# Patient Record
Sex: Male | Born: 1985 | Race: Black or African American | Hispanic: Yes | Marital: Married | Smoking: Former smoker
Health system: Southern US, Community
[De-identification: ages and names within clinical notes are randomized; demographics above are authoritative.]

## PROBLEM LIST (undated history)

## (undated) HISTORY — PX: SHOULDER SURGERY: SHX246

---

## 2018-06-24 ENCOUNTER — Encounter: Payer: Self-pay | Admitting: Emergency Medicine

## 2018-06-24 ENCOUNTER — Other Ambulatory Visit: Payer: Self-pay

## 2018-06-24 ENCOUNTER — Ambulatory Visit
Admission: EM | Admit: 2018-06-24 | Discharge: 2018-06-24 | Disposition: A | Discharge status: Home / Self Care | Payer: BLUE CROSS/BLUE SHIELD | Attending: Family Medicine | Admitting: Family Medicine

## 2018-06-24 DIAGNOSIS — R21 Rash and other nonspecific skin eruption: Secondary | ICD-10-CM

## 2018-06-24 MED ORDER — PREDNISONE 10 MG (21) PO TBPK: ORAL_TABLET | ORAL | 0 refills | Status: DC

## 2018-06-24 NOTE — ED Provider Notes (Signed)
[facility]    CSN: [id number] Arrival date & time: 06/24/18  1850  History   Chief Complaint Chief Complaint  Patient presents with  . Rash   HPI   32 year old male presents with rash.   Patient reports a 3-day history of rash.  Started on the face and has now spread diffusely.  It is essentially all over his body.  Mild itching.  He is taken Benadryl and Allegra without improvement.  She is thinks that he has been exposed to a new detergent and this may be the culprit.  No exacerbating factors.  He states that no one else at home has rash.  No other associated symptoms.  No other complaints/concerns at this time  History reviewed. No pertinent past medical history.  Past Surgical History:  Procedure Laterality Date  . SHOULDER SURGERY     Home Medications    Prior to Admission medications   Medication Sig Start Date End Date Taking? Authorizing Provider  diphenhydrAMINE (BENADRYL) 25 MG tablet Take 25 mg by mouth every 6 (six) hours as needed.   Yes [provider]  fexofenadine (ALLEGRA) 180 MG tablet Take 180 mg by mouth daily.   Yes [provider]  predniSONE (STERAPRED UNI-PAK 21 TAB) 10 MG (21) TBPK tablet 6 tablets on Day 1; then decrease by 1 tablet daily until gone. 06/24/18   Tommie Samsook, Jasmon Mattice G, DO   Family History Family History  Problem Relation Age of Onset  . Diabetes Mother    Social History Social History   Tobacco Use  . Smoking status: Current Every Day Smoker    Packs/day: 0.50  . Smokeless tobacco: Never Used  Substance Use Topics  . Alcohol use: Never    Frequency: Never  . Drug use: Never   Allergies   Patient has no known allergies.  Review of Systems Review of Systems  Constitutional: Negative.   Skin: Positive for rash.   Physical Exam Triage Vital Signs ED Triage Vitals  Enc Vitals Group     BP 06/24/18 1904 (!) 147/88     Pulse Rate 06/24/18 1904 74     Resp 06/24/18 1904 18     Temp 06/24/18 1904  98.8 F (37.1 C)     Temp Source 06/24/18 1904 Oral     SpO2 06/24/18 1904 97 %     Weight 06/24/18 1901 273 lb (123.8 kg)     Height 06/24/18 1901 5\' 11"  (1.803 m)     Head Circumference --      Peak Flow --      Pain Score 06/24/18 1901 0     Pain Loc --      Pain Edu? --      Excl. in GC? --   Updated Vital Signs BP (!) 147/88 (BP Location: Left Arm)   Pulse 74   Temp 98.8 F (37.1 C) (Oral)   Resp 18   Ht 5\' 11"  (1.803 m)   Wt 273 lb (123.8 kg)   SpO2 97%   BMI 38.08 kg/m    Physical Exam  Constitutional: He is oriented to person, place, and time. He appears well-developed. No distress.  Cardiovascular: Normal rate and regular rhythm.  Pulmonary/Chest: Effort normal. No respiratory distress.  Neurological: He is alert and oriented to person, place, and time.  Skin:  Diffuse, fine erythematous papular rash.  Psychiatric: He has a normal mood and affect. His behavior is normal.  Nursing note and vitals reviewed.  UC Treatments /  Results  Labs (all labs ordered are listed, but only abnormal results are displayed) Labs Reviewed - No data to display  EKG None  Radiology No results found.  Procedures Procedures (including critical care time)  Medications Ordered in UC Medications - No data to display  Initial Impression / Assessment and Plan / UC Course  I have reviewed the triage vital signs and the nursing notes.  Pertinent labs & imaging results that were available during my care of the patient were reviewed by me and considered in my medical decision making (see chart for details).    32 year old male presents with rash.  Suspect allergic response.  Treating with prednisone.  Continue Benadryl.  If fails to improve, should see dermatology.   Final Clinical Impressions(s) / UC Diagnoses   Final diagnoses:  Rash     Discharge Instructions     Prednisone as prescribed.  Continue Benadryl.  If no improvement, see Dermatology.  Take care  Dr.  Adriana Simas    ED Prescriptions    Medication Sig Dispense Auth. Provider   predniSONE (STERAPRED UNI-PAK 21 TAB) 10 MG (21) TBPK tablet 6 tablets on Day 1; then decrease by 1 tablet daily until gone. 21 tablet Tommie Sams, DO     Controlled Substance Prescriptions [place] Controlled Substance Registry consulted? Not Applicable  Tommie Sams, DO 06/24/18 1941

## 2018-06-24 NOTE — Discharge Instructions (Signed)
Prednisone as prescribed.  Continue Benadryl.  If no improvement, see Dermatology.  Take care  Dr. Adriana Simasook

## 2018-06-24 NOTE — ED Triage Notes (Signed)
Patient reports raised, itchy rash on upper body x 3 days. He has tried OTC Benadryl and Allegra with no relief. Reports he has not changed soaps or detergents recently.

## 2020-07-31 ENCOUNTER — Ambulatory Visit: Payer: 59 | Admitting: Podiatry

## 2020-07-31 ENCOUNTER — Ambulatory Visit (INDEPENDENT_AMBULATORY_CARE_PROVIDER_SITE_OTHER): Payer: 59

## 2020-07-31 ENCOUNTER — Encounter: Payer: Self-pay | Admitting: Podiatry

## 2020-07-31 ENCOUNTER — Other Ambulatory Visit: Payer: Self-pay

## 2020-07-31 DIAGNOSIS — M2140 Flat foot [pes planus] (acquired), unspecified foot: Secondary | ICD-10-CM

## 2020-07-31 DIAGNOSIS — M722 Plantar fascial fibromatosis: Secondary | ICD-10-CM

## 2020-07-31 DIAGNOSIS — M2141 Flat foot [pes planus] (acquired), right foot: Secondary | ICD-10-CM

## 2020-07-31 DIAGNOSIS — M2142 Flat foot [pes planus] (acquired), left foot: Secondary | ICD-10-CM | POA: Diagnosis not present

## 2020-07-31 MED ORDER — METHYLPREDNISOLONE 4 MG PO TBPK: ORAL_TABLET | ORAL | 0 refills | Status: DC

## 2020-07-31 MED ORDER — MELOXICAM 15 MG PO TABS: 15.0000 mg | ORAL_TABLET | Freq: Every day | ORAL | 3 refills | Status: AC

## 2020-07-31 NOTE — Progress Notes (Signed)
Subjective:  Patient ID: Erik Church, male    DOB: 1986-05-28,  MRN: [id number] HPI Chief Complaint  Patient presents with  . Foot Pain    Patient presents today for bilat flat feet x years   he says his feet hurt all over and his ankles feel stiff.  Its a dull ache and sharp pains mostly in the afternoons.  He has been in physical therapy from [place] which has not helped.  He has also received custom orthotics from [place] which is not helping    34 y.o. male presents with the above complaint.   ROS: Denies fever chills nausea vomiting muscle aches pains calf pain back pain chest pain shortness of breath.  No past medical history on file. Past Surgical History:  Procedure Laterality Date  . SHOULDER SURGERY      Current Outpatient Medications:  .  amLODipine (NORVASC) 5 MG tablet, TAKE ONE-HALF TABLET BY MOUTH EVERY DAY FOR BLOOD PRESSURE, Disp: , Rfl:  .  sertraline (ZOLOFT) 100 MG tablet, TAKE ONE TABLET BY MOUTH ONCE EVERY DAY FOR ANXIETY, Disp: , Rfl:  .  traZODone (DESYREL) 50 MG tablet, TAKE ONE-HALF TO ONE TABLET BY MOUTH AT BEDTIME AS NEEDED FOR SLEEP, Disp: , Rfl:  .  meloxicam (MOBIC) 15 MG tablet, Take 1 tablet (15 mg total) by mouth daily., Disp: 30 tablet, Rfl: 3 .  methylPREDNISolone (MEDROL DOSEPAK) 4 MG TBPK tablet, 6 day dose pack - take as directed, Disp: 21 tablet, Rfl: 0  No Known Allergies Review of Systems Objective:  There were no vitals filed for this visit.  General: Well developed, nourished, in no acute distress, alert and oriented x3   Dermatological: Skin is warm, dry and supple bilateral. Nails x 10 are well maintained; remaining integument appears unremarkable at this time. There are no open sores, no preulcerative lesions, no rash or signs of infection present.  Vascular: Dorsalis Pedis artery and Posterior Tibial artery pedal pulses are 2/4 bilateral with immedate capillary fill time. Pedal hair growth present. No varicosities and no lower extremity  edema present bilateral.   Neruologic: Grossly intact via light touch bilateral. Vibratory intact via tuning fork bilateral. Protective threshold with Semmes Wienstein monofilament intact to all pedal sites bilateral. Patellar and Achilles deep tendon reflexes 2+ bilateral. No Babinski or clonus noted bilateral.   Musculoskeletal: No gross boney pedal deformities bilateral. No pain, crepitus, or limitation noted with foot and ankle range of motion bilateral. Muscular strength 5/5 in all groups tested bilateral.  Flatfoot deformity bilateral.  He has flat feet have very good E version but almost no inversion and it feels that his peroneals are spastic.  Most likely this is peroneal spastic flatfoot deformity as well.  He also has pain on palpation medial calcaneal tubercles bilateral.  Gait: Unassisted, Nonantalgic.    Radiographs:  Radiographs taken today demonstrate severe flatfoot deformity with soft tissue increase in density plantar fifth calcaneal insertion site no other significant osseous abnormalities are visualized.  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  At this point I would try to find someone that may be able to do peroneal muscle Botox injections.  I am also going to start him on methylprednisolone to be followed by meloxicam.  I injected the bilateral heels today and placed him in bilateral plantar fascial braces.  We will also get a night splint for him.  We discussed appropriate  shoe gear stretching exercise ice therapy shoe gear modifications I will follow-up with him in 1 month.     Rickiya Picariello T. [place], [place]

## 2020-08-23 ENCOUNTER — Telehealth: Payer: Self-pay

## 2020-08-23 DIAGNOSIS — M2140 Flat foot [pes planus] (acquired), unspecified foot: Secondary | ICD-10-CM

## 2020-08-23 NOTE — Telephone Encounter (Signed)
Referral has been entered in chart and sent to [facility]

## 2020-08-23 NOTE — Telephone Encounter (Signed)
-----   Message from Kristian Covey, [facility] sent at 08/08/2020  8:43 AM EDT ----- Regarding: Referral to Neurologist Dr. Al Corpus wanted this patient to see a Neurologist for evaluate of Botox therapy for spastic peroneal flatfoot   Dr. Lurena Joiner Tat or Dr. Raul Del Neurology

## 2020-08-27 ENCOUNTER — Other Ambulatory Visit: Payer: Self-pay | Admitting: Podiatry

## 2020-08-27 NOTE — Telephone Encounter (Signed)
Please Advise

## 2020-08-30 ENCOUNTER — Other Ambulatory Visit: Payer: Self-pay | Admitting: Podiatry

## 2020-09-04 ENCOUNTER — Encounter: Payer: Self-pay | Admitting: Podiatry

## 2020-09-04 ENCOUNTER — Other Ambulatory Visit: Payer: Self-pay

## 2020-09-04 ENCOUNTER — Ambulatory Visit: Payer: 59 | Admitting: Podiatry

## 2020-09-04 DIAGNOSIS — M7751 Other enthesopathy of right foot: Secondary | ICD-10-CM | POA: Diagnosis not present

## 2020-09-04 DIAGNOSIS — M7752 Other enthesopathy of left foot: Secondary | ICD-10-CM | POA: Diagnosis not present

## 2020-09-04 DIAGNOSIS — M2140 Flat foot [pes planus] (acquired), unspecified foot: Secondary | ICD-10-CM

## 2020-09-04 DIAGNOSIS — M722 Plantar fascial fibromatosis: Secondary | ICD-10-CM

## 2020-09-04 NOTE — Progress Notes (Signed)
He presents today states that his feet really are not any better he never received a call from the neurologist to see about the spastic flatfoot.  He states that he still hurts right here as he points to the heels and sinus tarsi areas bilaterally.  Objective: Signs are stable he is alert oriented x3.  Pulses are palpable.  Severe pes planus with peroneal spastic flatfoot he has pain on attempted inversion of the foot and forced eversion of the foot.  He has pain on palpation of the sinus tarsi.  Assessment: Severe pes planus bilateral right greater than left with sinus tarsitis bilaterally.  Plan: At this point we cannot rule out coalitions I would recommend an MRI to rule out coalitions and peroneal tears will posterior tibial tendon tears either 1 and then this is for surgical consideration.  We are going to continue to try to find a neurologist or an orthopedist that we will do Botox for peroneal spastic flatfoot.  I also injected his subtalar joint today 20 mg Kenalog 5 mg Marcaine bilaterally.  Follow-up with him once the MRI is complete.

## 2020-09-05 ENCOUNTER — Other Ambulatory Visit: Payer: Self-pay | Admitting: Podiatry

## 2020-09-05 DIAGNOSIS — M7751 Other enthesopathy of right foot: Secondary | ICD-10-CM

## 2020-09-30 ENCOUNTER — Ambulatory Visit
Admission: RE | Admit: 2020-09-30 | Discharge: 2020-09-30 | Disposition: A | Discharge status: Home / Self Care | Payer: 59 | Source: Ambulatory Visit | Attending: Podiatry | Admitting: Podiatry

## 2020-09-30 ENCOUNTER — Other Ambulatory Visit: Payer: Self-pay

## 2020-09-30 DIAGNOSIS — M7751 Other enthesopathy of right foot: Secondary | ICD-10-CM

## 2020-10-07 NOTE — Progress Notes (Signed)
Looks like a Charyl Dancer is in order to me! I'll take a look at him, thanks!

## 2020-10-18 ENCOUNTER — Telehealth: Payer: Self-pay

## 2020-10-18 NOTE — Telephone Encounter (Signed)
-----   Message from Elinor Parkinson, [place] sent at 10/07/2020  7:09 AM EDT ----- Karoline Caldwell please make an appointment for Erik Church to see Dr. Lilian Kapur.  I think that this is going to be a surgical consideration and Dr. Lilian Kapur is most equipped to deal with this.  Thanks.

## 2020-10-18 NOTE — Telephone Encounter (Signed)
Patient was called and message left on vm to call office to schedule appt with Dr. Lilian Kapur to discuss MRI results

## 2020-10-23 ENCOUNTER — Other Ambulatory Visit: Payer: Self-pay

## 2020-10-23 ENCOUNTER — Encounter: Payer: Self-pay | Admitting: Podiatry

## 2020-10-23 ENCOUNTER — Ambulatory Visit (INDEPENDENT_AMBULATORY_CARE_PROVIDER_SITE_OTHER): Payer: 59 | Admitting: Podiatry

## 2020-10-23 DIAGNOSIS — Q66221 Congenital metatarsus adductus, right foot: Secondary | ICD-10-CM

## 2020-10-23 DIAGNOSIS — M2011 Hallux valgus (acquired), right foot: Secondary | ICD-10-CM

## 2020-10-23 DIAGNOSIS — Q6689 Other  specified congenital deformities of feet: Secondary | ICD-10-CM | POA: Diagnosis not present

## 2020-10-23 DIAGNOSIS — M216X2 Other acquired deformities of left foot: Secondary | ICD-10-CM

## 2020-10-23 DIAGNOSIS — M21612 Bunion of left foot: Secondary | ICD-10-CM

## 2020-10-23 DIAGNOSIS — M21862 Other specified acquired deformities of left lower leg: Secondary | ICD-10-CM

## 2020-10-23 DIAGNOSIS — M2142 Flat foot [pes planus] (acquired), left foot: Secondary | ICD-10-CM

## 2020-10-23 DIAGNOSIS — Q66222 Congenital metatarsus adductus, left foot: Secondary | ICD-10-CM

## 2020-10-23 DIAGNOSIS — M2141 Flat foot [pes planus] (acquired), right foot: Secondary | ICD-10-CM | POA: Diagnosis not present

## 2020-10-23 DIAGNOSIS — M21861 Other specified acquired deformities of right lower leg: Secondary | ICD-10-CM

## 2020-10-23 DIAGNOSIS — M2012 Hallux valgus (acquired), left foot: Secondary | ICD-10-CM

## 2020-10-23 DIAGNOSIS — M21611 Bunion of right foot: Secondary | ICD-10-CM

## 2020-10-23 DIAGNOSIS — M216X1 Other acquired deformities of right foot: Secondary | ICD-10-CM

## 2020-10-23 NOTE — Progress Notes (Signed)
Subjective:  Patient ID: Erik Church, male    DOB: 1986/01/25,  MRN: [id number]  Chief Complaint  Patient presents with  . Ankle Pain    discuss MRI results    34 y.o. male presents with the above complaint. History confirmed with patient. He is referred to me by Dr Milinda Pointer for bilateral rigid spastic flat foot. He says he has had flat feet all his life, worsened after being in the [employer]. Recently completed MRI and is here for review. Has CMOs from the [place] which have not completely helped. He describes pain about the hindfoot and midfoot with plantar arch pain  Objective:  Physical Exam: warm, good capillary refill, no trophic changes or ulcerative lesions, normal DP and PT pulses and normal sensory exam.  Bilaterally he has severe rigid pes planovalgus with limited and painful hindfoot and midfoot ROM. Pain along peroneal tendons. Forefoot abduction with tibial crest aligned medial to 1st ray. Bunions present bilaterally with medial pinch tylomas.  Radiographs: X-ray of both feet: pes planovalgus with spurring about TN and CC, apparent incomplete coalition on oblique views  CLINICAL DATA:  Plantar right ankle pain for 15 years.  EXAM: MRI OF THE RIGHT ANKLE WITHOUT CONTRAST  TECHNIQUE: Multiplanar, multisequence MR imaging of the ankle was performed. No intravenous contrast was administered.  COMPARISON:  None.  FINDINGS: TENDONS  Peroneal: Unremarkable  Posteromedial: Trace tibialis posterior tenosynovitis.  Anterior: Unremarkable  Achilles: Unremarkable  Plantar Fascia: No significant thickening or surrounding edema. No findings of active plantar fasciitis.  LIGAMENTS  Lateral: Mildly attenuated and irregular anterior talofibular ligament, probably reflecting a healed prior injury.  Medial: Unremarkable  CARTILAGE  Ankle Joint: Mild chondral thinning with 0.5 cm focal chondral defect anteromedially along the tibial plafond and as on image  24/8.  Subtalar Joints/Sinus Tarsi: Unremarkable  Bones: Advanced for age arthropathy at the somewhat broader than expected articulation between the anterior process of the calcaneus and the navicular, for example on images 12 through 15 of series 6, difficult to exclude fibrous tarsal calcaneal navicular coalition. Dorsal spurring of the talar head along with scattered advanced for age midfoot spurring.  Other: Flattening of the longitudinal arch of the foot suggesting pes planus.  IMPRESSION: 1. Pes planus, with advanced for age midfoot spurring, dorsal spurring of the talar head, and a broad articulation between the anterior process of the calcaneus and the navicular with cortical and subcortical irregularity raising the possibility of mild fibrous calcaneonavicular tarsal coalition. 2. Trace tibialis posterior tenosynovitis. 3. Mildly attenuated and irregular anterior talofibular ligament, probably reflecting a healed prior injury. 4. 0.5 cm focal chondral defect anteromedially along the tibial plafond.   Electronically Signed   By: Van Clines M.D.   On: 10/01/2020 08:45  Assessment:   1. Tarsal coalition of right foot   2. Pes planus of both feet   3. Metatarsus adductus of both feet   4. Hallux valgus with bunions, right   5. Hallux valgus with bunions, left   6. Gastrocnemius equinus of right lower extremity   7. Gastrocnemius equinus of left lower extremity      Plan:  Patient was evaluated and treated and all questions answered.  I discussed the etiology, pathomechanics, and surgical nonsurgical treatment options in detail with the patient today.  I also reviewed his most recent plain film radiographs and right ankle MRI.  He has previously had custom-made orthotics from the [facility] and these were not helpful.  He has a rigid spastic  flatfoot.  I recommend surgical intervention, as he has thus far failed nonsurgical therapy.   Discussed with him twosurgical options, resecting the tarsal coalition in conjunction with a gastrocnemius recession versus this plus surgical reconstruction of his flatfoot deformity.  I discussed the risks and benefits of both pathways, it is possible that majority of his symptoms could be relieved by resecting the coalition allowing his joints to move in situ in his current pes planus position.    Also discussed with him that his pes planus is severe, and it is possible that he will continue to have pain and develop arthritis later in life in his hindfoot.  This could be addressed at a later date if it is symptomatic.  He will consider both options.  I discussed with him the postop protocols for both.  After reviewing his MRIs I do not think that he will need arthrodesis of his hindfoot, reconstruction would likely include a Badgley procedure to resect the coalition, a Strayer gastrocnemius recession, Cotton osteotomy of the medial cuneiform, Evans osteotomy and medial displacement osteotomy of the calcaneus, and possible flexor digitorum longus transfer.  I discussed with him how his met adductus contributes to the deviation of the forefoot, and this is currently a mass due to his hindfoot eversion and forefoot abduction.  It is possible he will develop a symptomatic bunion after correction of the hindfoot, if this is symptomatic in the future we can address this but I would not address this at this time.  He will consider the above and return when he is ready to plan surgery in the next few months.  At his next visit when he is ready to schedule his surgery we will take long-leg calcaneal axial views to evaluate the hindfoot eversion.   Return for return when ready to plan surgery.

## 2020-10-23 NOTE — Patient Instructions (Addendum)
Procedures we will consider: removing the coalition, Evans osteotomy, Cotton osteotomy, medial displacement osteotomy of the calcaneus (heel bone), gastrocnemius lengthening (calf muscle), tendon transfer (flexor digitorum longus)     Tarsal Coalition  Tarsal coalition is a type of foot disorder. Seven bones (tarsals) make up the back of your foot, which includes the heel. Normally, these bones move freely within the foot and alongside one another. If you have a tarsal coalition, two or more of these bones have abnormally fused together in a bridge (bar) of bone, cartilage, or fibrous tissue. This can cause pain, difficulty walking, and other foot problems. Tarsal coalition can affect one foot or both feet. The condition can be mild or severe, depending on:  How many tarsal bones are affected. Often, two bones are affected, but it can be more.  The size of the bars. Larger bars lead to more severe symptoms. What are the causes? The most common cause of this condition is a defect in the genes (mutation) that is passed down through families. In rare cases, this condition can be caused by:  Arthritis in the feet.  A previous foot injury.  An infection. What increases the risk? This condition is more likely to develop in:  Children and adolescents.  People who have arthritis.  People who have had a previous foot injury or infection. What are the signs or symptoms? Symptoms of tarsal coalition often begin in childhood or adolescence as bones begin to harden. Symptoms include:  A flat foot.  Difficulty walking.  Walking with a limp.  Foot stiffness.  Tiring easily when walking or standing.  Pain when standing or walking.  Muscle spasms. How is this diagnosed? This condition is diagnosed based on your symptoms and medical history. Your health care provider will do a physical exam to check your foot. Your health care provider may also do imaging tests to confirm the diagnosis.  These include:  X-ray.  MRI.  CT scan. How is this treated? Treatment for tarsal coalition depends on the severity of your condition. Treatment may include:  Rest.  Over-the-counter medicines for pain, such as NSAIDs.  Steroid injections.  Physical therapy.  Orthotics, such as heel cups, wedges, and arch supports.  A cast or a walking boot. You may need surgery to repair the tarsal coalition if nonsurgical treatments do not help. Follow these instructions at home: If you have a boot:  Wear the boot as told by your health care provider. Remove it only as told by your health care provider.  Loosen the boot if your toes tingle, become numb, or turn cold and blue.  Keep the boot clean.  If the boot is not waterproof: ? Do not let it get wet. ? Cover it with a watertight covering when you take a bath or shower. If you have a cast:  Do not stick anything inside the cast to scratch your skin. Doing that increases your risk of infection.  Check the skin around the cast every day. Tell your health care provider about any concerns.  You may put lotion on dry skin around the edges of the cast. Do not put lotion on the skin underneath the cast.  Keep the cast clean.  If the cast is not waterproof: ? Do not let it get wet. ? Cover it with a watertight covering when you take a bath or a shower. Managing pain, stiffness, and swelling   If directed, put ice on the injured area. ? If you have a  removable walking boot, remove it as told by your health care provider. ? Put ice in a plastic bag. ? Place a towel between your skin and the bag or between your cast and the bag. ? Leave the ice on for 20 minutes, 2-3 times per day.  Move your toes often to reduce stiffness and swelling.  Raise (elevate) the injured area above the level of your heart while you are sitting or lying down. General instructions  Take over-the-counter and prescription medicines only as told by your  health care provider.  Ask your health care provider when it is safe to drive if you have a cast or a walking boot on your foot.  Return to your normal activities as told by your health care provider. Ask your health care provider what activities are safe for you.  Do exercises daily as told by your health care provider or physical therapist.  Keep all follow-up visits as told by your health care provider. This is important. Contact a health care provider if:  Your pain gets worse.  You have new pain in your foot.  Your muscle spasms get worse.  You have a harder time with walking. Get help right away if:  Your foot is numb or cold.  Your toenails turn blue, gray, or another dark color. Summary  Tarsal coalition is a type of foot disorder. Seven bones (tarsals) make up the back of your foot, which includes the heel.  If you have a tarsal coalition, two or more of these bones have abnormally fused together in a bridge (bar) of bone, cartilage, or fibrous tissue.  This condition may be treated with rest, medicines, physical therapy, orthotics, or a cast or walking boot.  Follow your health care provider's instructions for self-care, including how to care for a walking boot or cast.  Contact your health care provider if you have new pain or your pain gets worse. Get help right away if your foot is numb or cold, or if your toenails turn blue, gray, or dark. This information is not intended to replace advice given to you by your health care provider. Make sure you discuss any questions you have with your health care provider. Document Revised: 11/03/2018 Document Reviewed: 11/03/2018 Elsevier Patient Education  2020 [employer].

## 2020-12-18 ENCOUNTER — Encounter: Payer: Self-pay | Admitting: Podiatry

## 2022-01-06 ENCOUNTER — Other Ambulatory Visit: Payer: Self-pay

## 2022-01-06 ENCOUNTER — Ambulatory Visit (INDEPENDENT_AMBULATORY_CARE_PROVIDER_SITE_OTHER): Payer: 59

## 2022-01-06 ENCOUNTER — Ambulatory Visit
Admission: EM | Admit: 2022-01-06 | Discharge: 2022-01-06 | Disposition: A | Discharge status: Home / Self Care | Payer: 59 | Attending: Internal Medicine | Admitting: Internal Medicine

## 2022-01-06 ENCOUNTER — Encounter: Payer: Self-pay | Admitting: Licensed Clinical Social Worker

## 2022-01-06 DIAGNOSIS — M542 Cervicalgia: Secondary | ICD-10-CM | POA: Diagnosis not present

## 2022-01-06 DIAGNOSIS — S161XXA Strain of muscle, fascia and tendon at neck level, initial encounter: Secondary | ICD-10-CM

## 2022-01-06 MED ORDER — DICLOFENAC SODIUM 1 % EX GEL: 2.0000 g | Freq: Four times a day (QID) | CUTANEOUS | 0 refills | Status: AC

## 2022-01-06 NOTE — ED Triage Notes (Signed)
Pt c/o neck pain after stretching this morning, feels tight and having limited movement.

## 2022-01-06 NOTE — ED Provider Notes (Signed)
[facility]    CSN: [id number] Arrival date & time: 01/06/22  1221      History   Chief Complaint Chief Complaint  Patient presents with   Neck Injury    HPI Erik Church is a 36 y.o. male. He was stretching this am and hyperextended neck while externally rotating his shoulders; experienced the sudden onset of pain in the junction of his left neck/shoulder.  His neck now feels stiff/painful with pain in left neck/shoulder on lateral rotation/flexion of the neck.  No weakness/clumsiness of arms/legs, no bowel/bladder function change.  800 mg ibuprofen x 1 dose since onset is not helping.  HPI  History reviewed. No pertinent past medical history.  There are no problems to display for this patient.   Past Surgical History:  Procedure Laterality Date   SHOULDER SURGERY         Home Medications    Prior to Admission medications   Medication Sig Start Date End Date Taking? Authorizing Provider  amLODipine (NORVASC) 5 MG tablet TAKE ONE-HALF TABLET BY MOUTH EVERY DAY FOR BLOOD PRESSURE 05/08/20  Yes [provider]  diclofenac Sodium (VOLTAREN) 1 % GEL Apply 2 g topically 4 (four) times daily. 01/06/22  Yes Isa Rankin, MD  sertraline (ZOLOFT) 100 MG tablet TAKE ONE TABLET BY MOUTH ONCE EVERY DAY FOR ANXIETY 05/08/20  Yes [provider]  meloxicam (MOBIC) 15 MG tablet Take 1 tablet (15 mg total) by mouth daily. 07/31/20   Hyatt, Max T, DPM  traZODone (DESYREL) 50 MG tablet TAKE ONE-HALF TO ONE TABLET BY MOUTH AT BEDTIME AS NEEDED FOR SLEEP 05/08/20   [provider]    Family History Family History  Problem Relation Age of Onset   Diabetes Mother     Social History Social History   Tobacco Use   Smoking status: Every Day    Packs/day: 0.50    Types: Cigarettes   Smokeless tobacco: Never  Vaping Use   Vaping Use: Never used  Substance Use Topics   Alcohol use: Never   Drug use: Never     Allergies   Patient has  no known allergies.   Review of Systems Review of Systems see HPI   Physical Exam Triage Vital Signs ED Triage Vitals  Enc Vitals Group     BP 01/06/22 1329 131/74     Pulse Rate 01/06/22 1329 89     Resp 01/06/22 1328 16     Temp 01/06/22 1329 98.2 F (36.8 C)     Temp Source 01/06/22 1329 Oral     SpO2 01/06/22 1329 98 %     Weight 01/06/22 1326 290 lb (131.5 kg)     Height 01/06/22 1326 5\' 11"  (1.803 m)     Pain Score 01/06/22 1326 7     Pain Loc --    Updated Vital Signs BP 131/74 (BP Location: Left Arm)    Pulse 89    Temp 98.2 F (36.8 C) (Oral)    Resp 16    Ht 5\' 11"  (1.803 m)    Wt 131.5 kg    SpO2 98%    BMI 40.45 kg/m   Physical Exam Constitutional:      General: He is not in acute distress.    Appearance: He is not ill-appearing.     Comments: Looks worried  HENT:     Head: Atraumatic.     Mouth/Throat:     Mouth: Mucous membranes are moist.  Eyes:  Conjunctiva/sclera:     Right eye: Right conjunctiva is not injected. No exudate.    Left eye: Left conjunctiva is not injected. No exudate.    Comments: Conjugate gaze observed  Cardiovascular:     Rate and Rhythm: Normal rate.  Pulmonary:     Effort: Pulmonary effort is normal. No respiratory distress.  Abdominal:     General: There is no distension.  Musculoskeletal:     Comments: Painful in left neck/shoulder to laterally rotate neck, laterally flex neck.  Able to extend.  Feels best to forward flex.  No midline posterior percussion tenderness. Able to fully extend B shoulders overhead, int/ext rotate.  No weakness in arms. Able to walk into urgent care independently and climb on/off exam table  Skin:    General: Skin is warm and dry.     Comments: No cyanosis  Neurological:     Mental Status: He is alert.     Comments: Face symmetric; speech clear/coherent/logical     UC Treatments / Results  Labs (all labs ordered are listed, but only abnormal results are displayed) Labs Reviewed - No  data to display NA  EKG NA  Radiology DG Cervical Spine 2-3 Views  Result Date: 01/06/2022 CLINICAL DATA:  Neck injury. EXAM: CERVICAL SPINE - 2-3 VIEW COMPARISON:  None. FINDINGS: The cervical vertebral bodies are normally aligned. Disc spaces and vertebral bodies are maintained. No significant degenerative changes. No acute bony findings or abnormal prevertebral soft tissue swelling. The facets are normally aligned. The C1-2 articulations are maintained. The lung apices are clear. IMPRESSION: Normal alignment and no acute bony findings. Electronically Signed   By: Rudie Meyer M.D.   On: 01/06/2022 14:10    Procedures Procedures (including critical care time) Ice bag applied to Left neck/shoulder junction, with good improvement in pain  Medications Ordered in UC Medications - No data to display NA  Initial Impression / Assessment and Plan / UC Course  Fracture unlikely given absence of focal/bony tenderness.  Vascular accident unlikely given improvement in pain with ice and absence of rapidly progressive soft tissue swelling in lateral neck, neurologic findings on exam.  Findings suggest soft tissue injury.  Manage symptoms and observe for improvement.      Final Clinical Impressions(s) / UC Diagnoses   Final diagnoses:  Acute strain of neck muscle, initial encounter     Discharge Instructions      Anticipate gradual improvement in neck/shoulder discomfort over the next several days.  No danger signs on exam.  Xray without bony abnormalities.  Note for work today.  Okay to stretch gently, but avoid activities that markedly increase the pain.  Use ice for 10 to 15 minutes several times daily, to reduce inflammation and help with pain.  Prescription for diclofenac gel (anti-inflammatory/pain reliever) sent to the pharmacy. Physical therapy for neck/shoulder discomfort is often very helpful in managing discomfort that lasts for more than a week or two.     ED Prescriptions      Medication Sig Dispense Auth. Provider   diclofenac Sodium (VOLTAREN) 1 % GEL Apply 2 g topically 4 (four) times daily. 150 g Isa Rankin, MD      PDMP not reviewed this encounter.   Isa Rankin, MD 01/07/22 1154

## 2022-01-06 NOTE — Discharge Instructions (Addendum)
Anticipate gradual improvement in neck/shoulder discomfort over the next several days.  No danger signs on exam.  Xray without bony abnormalities.  Note for work today.  Okay to stretch gently, but avoid activities that markedly increase the pain.  Use ice for 10 to 15 minutes several times daily, to reduce inflammation and help with pain.  Prescription for diclofenac gel (anti-inflammatory/pain reliever) sent to the pharmacy. Physical therapy for neck/shoulder discomfort is often very helpful in managing discomfort that lasts for more than a week or two.

## 2022-12-05 ENCOUNTER — Ambulatory Visit
Admission: EM | Admit: 2022-12-05 | Discharge: 2022-12-05 | Disposition: A | Discharge status: Home / Self Care | Payer: BC Managed Care – PPO | Attending: Physician Assistant | Admitting: Physician Assistant

## 2022-12-05 ENCOUNTER — Ambulatory Visit (INDEPENDENT_AMBULATORY_CARE_PROVIDER_SITE_OTHER): Payer: BC Managed Care – PPO

## 2022-12-05 ENCOUNTER — Encounter: Payer: Self-pay | Admitting: Emergency Medicine

## 2022-12-05 DIAGNOSIS — M79644 Pain in right finger(s): Secondary | ICD-10-CM

## 2022-12-05 DIAGNOSIS — S60029A Contusion of unspecified index finger without damage to nail, initial encounter: Secondary | ICD-10-CM | POA: Diagnosis not present

## 2022-12-05 DIAGNOSIS — W2209XA Striking against other stationary object, initial encounter: Secondary | ICD-10-CM

## 2022-12-05 NOTE — ED Triage Notes (Signed)
Patient states that he hit his right 2nd finger on a dresser about 2 hours ago.  Patient c/o pain and swelling in his finger.

## 2022-12-05 NOTE — ED Provider Notes (Signed)
[facility]    CSN: [id number] Arrival date & time: 12/05/22  1335      History   Chief Complaint Chief Complaint  Patient presents with   Finger Injury    Right 2nd finger    HPI Earvin Blazier is a 36 y.o. male who presents due to hitting his 2nd R finger on the dresser  on dorsal mid finger area 2 hours ago and got swollen right away. Has pain with palpation of area of pain. Able to move it fine.   History reviewed. No pertinent past medical history.  There are no problems to display for this patient.   Past Surgical History:  Procedure Laterality Date   SHOULDER SURGERY         Home Medications    Prior to Admission medications   Medication Sig Start Date End Date Taking? Authorizing Provider  amLODipine (NORVASC) 5 MG tablet TAKE ONE-HALF TABLET BY MOUTH EVERY DAY FOR BLOOD PRESSURE 05/08/20   [provider]  diclofenac Sodium (VOLTAREN) 1 % GEL Apply 2 g topically 4 (four) times daily. 01/06/22   Isa Rankin, MD  meloxicam (MOBIC) 15 MG tablet Take 1 tablet (15 mg total) by mouth daily. 07/31/20   Hyatt, Max T, DPM  sertraline (ZOLOFT) 100 MG tablet TAKE ONE TABLET BY MOUTH ONCE EVERY DAY FOR ANXIETY 05/08/20   [provider]  traZODone (DESYREL) 50 MG tablet TAKE ONE-HALF TO ONE TABLET BY MOUTH AT BEDTIME AS NEEDED FOR SLEEP 05/08/20   [provider]    Family History Family History  Problem Relation Age of Onset   Diabetes Mother     Social History Social History   Tobacco Use   Smoking status: Former    Packs/day: 0.50    Types: Cigarettes   Smokeless tobacco: Never  Vaping Use   Vaping Use: Never used  Substance Use Topics   Alcohol use: Never   Drug use: Never     Allergies   Patient has no known allergies.   Review of Systems Review of Systems  Musculoskeletal:  Positive for arthralgias.  Skin:  Negative for color change, pallor and wound.  Neurological:  Negative for weakness and  numbness.     Physical Exam Triage Vital Signs ED Triage Vitals  Enc Vitals Group     BP 12/05/22 1458 129/82     Pulse Rate 12/05/22 1458 69     Resp 12/05/22 1458 16     Temp 12/05/22 1458 98.4 F (36.9 C)     Temp Source 12/05/22 1458 Oral     SpO2 12/05/22 1458 99 %     Weight 12/05/22 1456 300 lb (136.1 kg)     Height 12/05/22 1456 5\' 11"  (1.803 m)     Head Circumference --      Peak Flow --      Pain Score 12/05/22 1456 3     Pain Loc --      Pain Edu? --      Excl. in GC? --    No data found.  Updated Vital Signs BP 129/82 (BP Location: Left Arm)   Pulse 69   Temp 98.4 F (36.9 C) (Oral)   Resp 16   Ht 5\' 11"  (1.803 m)   Wt 300 lb (136.1 kg)   SpO2 99%   BMI 41.84 kg/m   Visual Acuity Right Eye Distance:   Left Eye Distance:   Bilateral Distance:    Right Eye Near:  Left Eye Near:    Bilateral Near:     Physical Exam Vitals and nursing note reviewed.  Constitutional:      General: He is not in acute distress.    Appearance: He is obese. He is not toxic-appearing.  Eyes:     General: No scleral icterus.    Conjunctiva/sclera: Conjunctivae normal.  Pulmonary:     Effort: Pulmonary effort is normal.  Musculoskeletal:        General: Normal range of motion.     Cervical back: Neck supple.     Comments: R INDEX FINGER- has mild swelling, no ecchymosis or trauma of skin. Is tender on mid shaft of this finger on dorsal region. ROM is normal.   Skin:    General: Skin is warm and dry.     Capillary Refill: Capillary refill takes less than 2 seconds.     Findings: No rash.  Neurological:     General: No focal deficit present.     Mental Status: He is alert and oriented to person, place, and time.     Gait: Gait normal.  Psychiatric:        Mood and Affect: Mood normal.        Behavior: Behavior normal.        Thought Content: Thought content normal.        Judgment: Judgment normal.      UC Treatments / Results  Labs (all labs ordered are  listed, but only abnormal results are displayed) Labs Reviewed - No data to display  EKG   Radiology DG Finger Index Right  Result Date: 12/05/2022 CLINICAL DATA:  Right second finger pain after hitting on dresser today. EXAM: RIGHT INDEX FINGER 2+V COMPARISON:  None Available. FINDINGS: There is no evidence of fracture or dislocation. There is no evidence of arthropathy or other focal bone abnormality. Soft tissues are unremarkable. IMPRESSION: Negative. Electronically Signed   By: Signa Kell M.D.   On: 12/05/2022 15:29    Procedures Procedures (including critical care time)  Medications Ordered in UC Medications - No data to display  Initial Impression / Assessment and Plan / UC Course  I have reviewed the triage vital signs and the nursing notes.  Pertinent  imaging results that were available during my care of the patient were reviewed by me and considered in my medical decision making (see chart for details).  R index finger contusion  See instructions   Final Clinical Impressions(s) / UC Diagnoses   Final diagnoses:  Contusion of index finger without damage to nail, initial encounter     Discharge Instructions      Ice area of pain for 15 minutes 3-4 times a day today and tomorrow You may take Ibuprofen or Tylenol as needed for pain.      ED Prescriptions   None    PDMP not reviewed this encounter.   Garey Ham, PA-C 12/05/22 1609

## 2022-12-05 NOTE — Discharge Instructions (Addendum)
Ice area of pain for 15 minutes 3-4 times a day today and tomorrow You may take Ibuprofen or Tylenol as needed for pain.

## 2024-01-04 IMAGING — CR DG CERVICAL SPINE 2 OR 3 VIEWS
4 series · 4 of 4 positions shown · non-contrast
Comparison: None.

CLINICAL DATA: Neck injury.

EXAM:
CERVICAL SPINE - 2-3 VIEW

[c-spine lat]
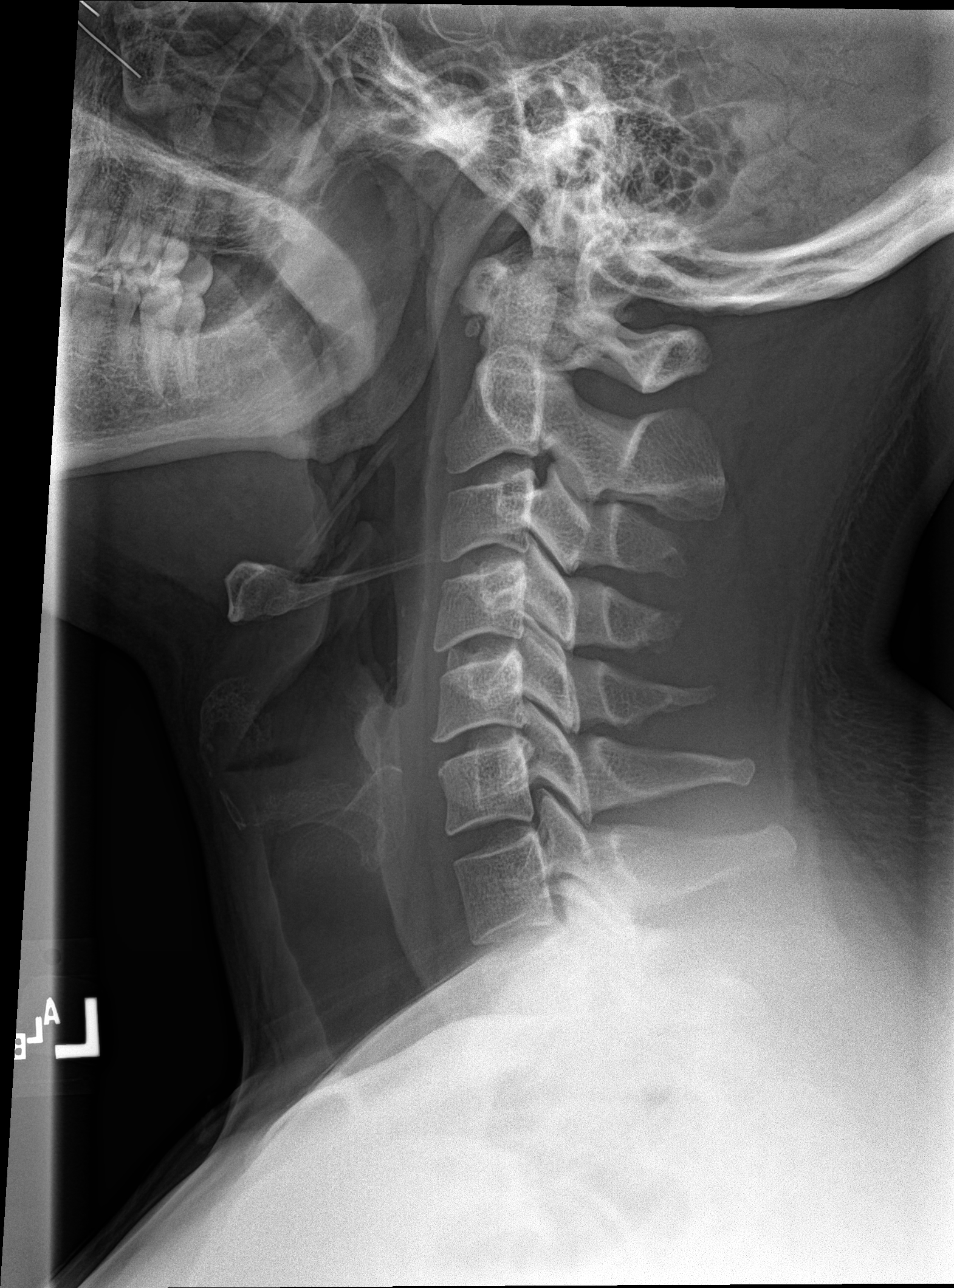

[c-spine ap]
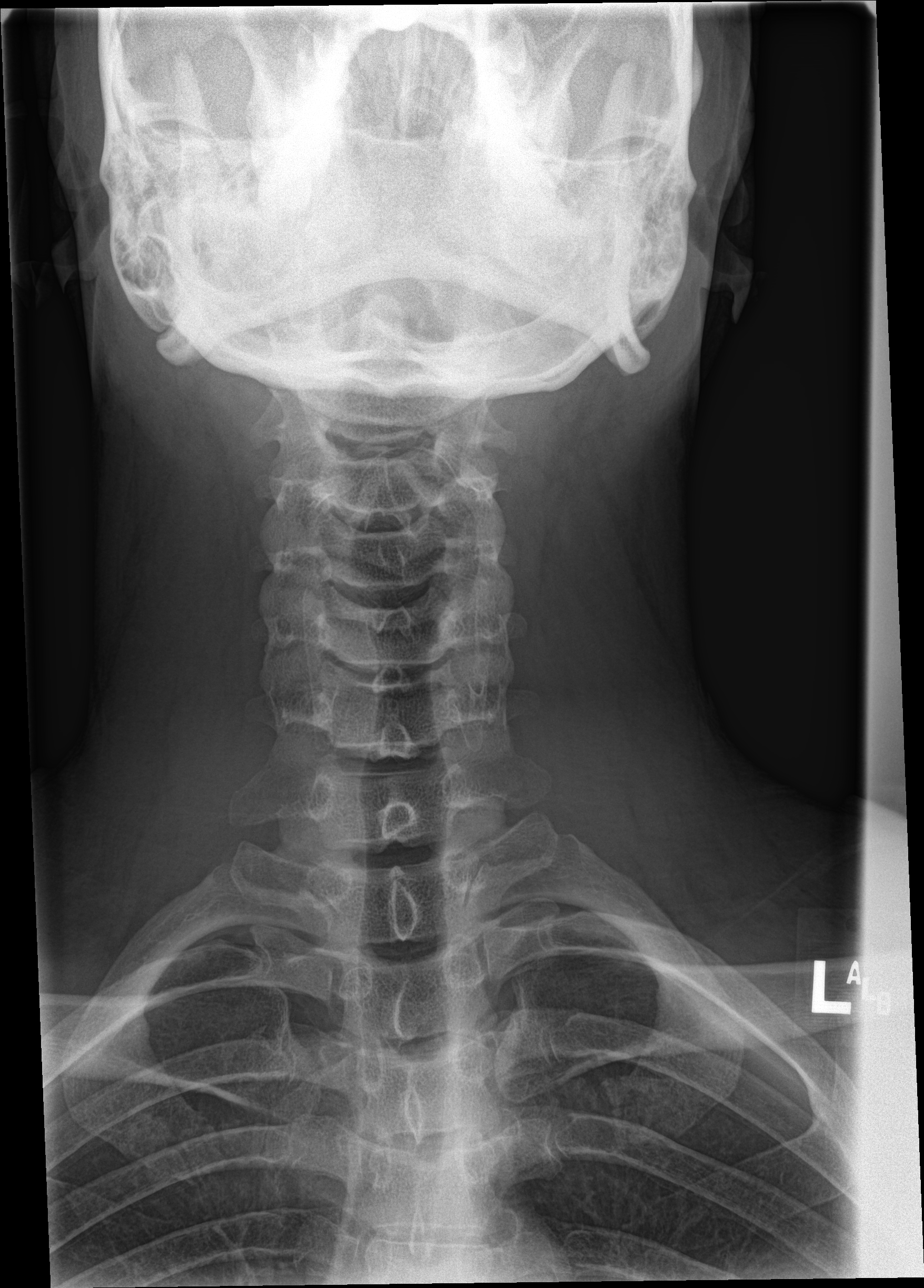

[c-spine open mouth]
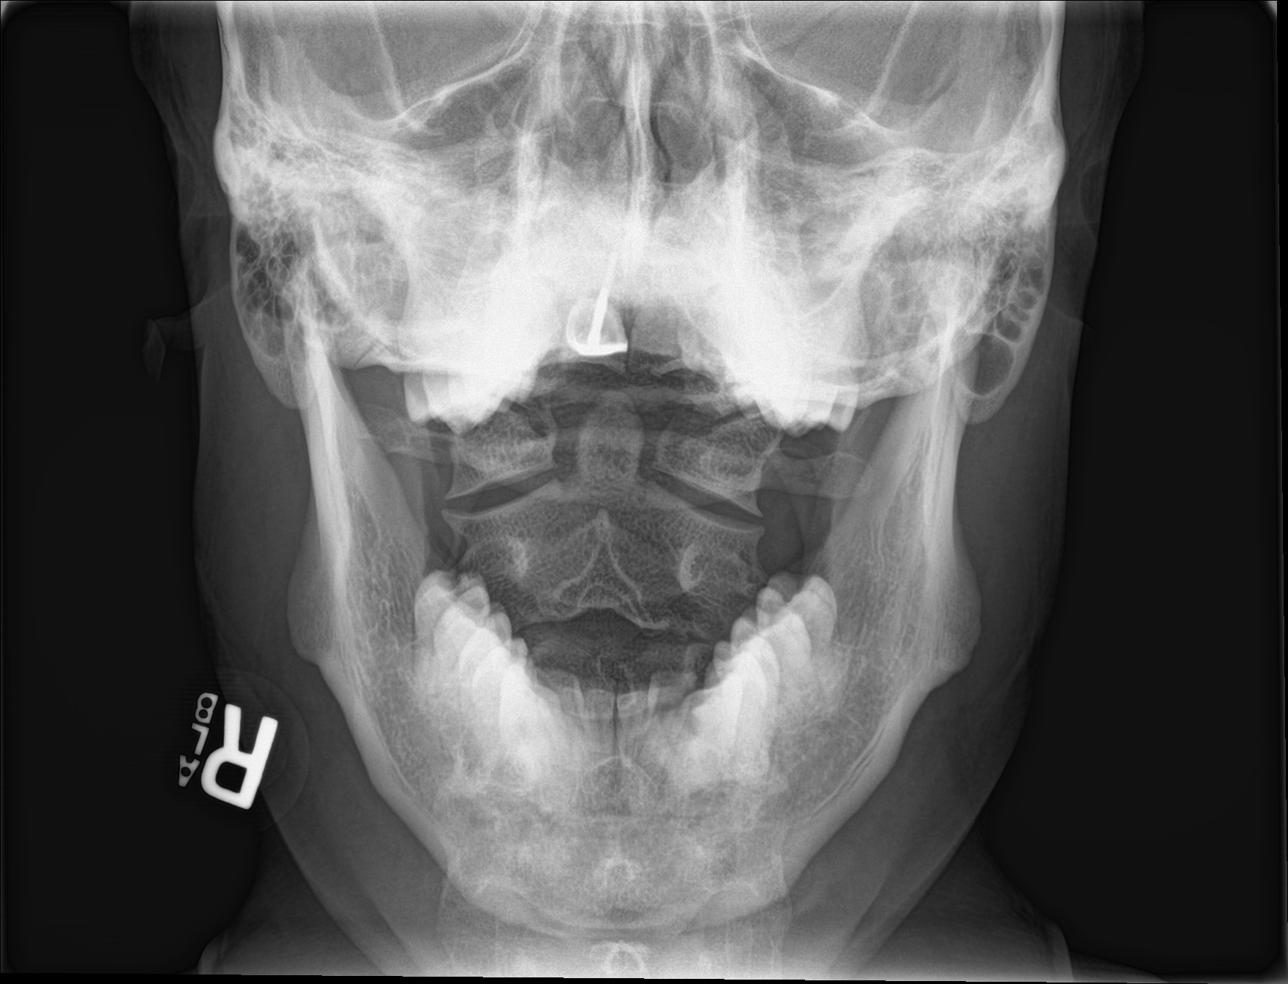

[ct-spine swimmers]
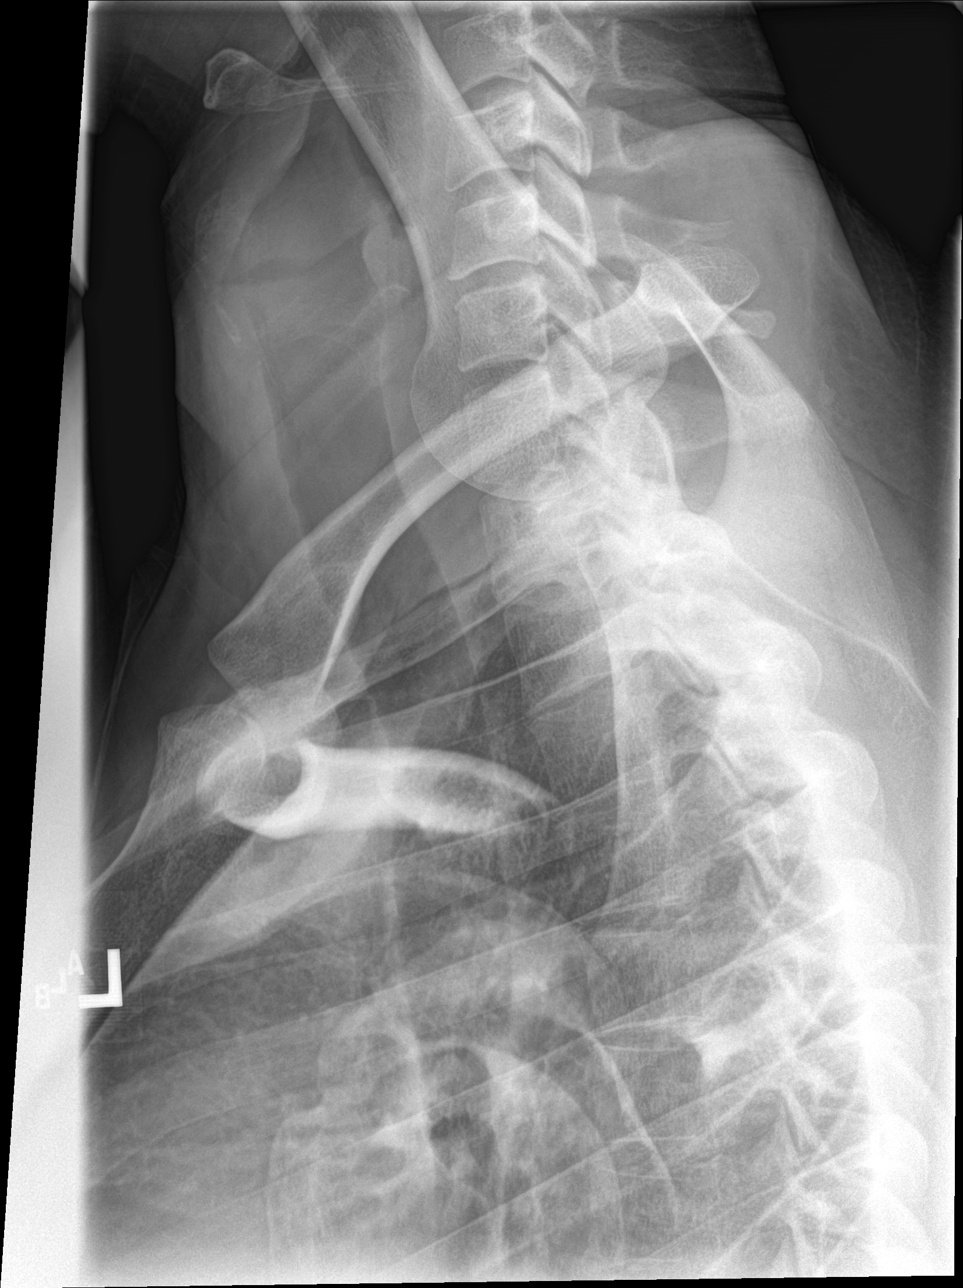

[4 of 4 positions shown; findings below may reference images not displayed]

FINDINGS: The cervical vertebral bodies are normally aligned. Disc spaces and
vertebral bodies are maintained. No significant degenerative
changes. No acute bony findings or abnormal prevertebral soft tissue
swelling. The facets are normally aligned. The C1-2 articulations
are maintained. The lung apices are clear.
IMPRESSION: Normal alignment and no acute bony findings.
